# Patient Record
Sex: Female | Born: 1965 | Race: White | Marital: Married | Smoking: Current every day smoker
Health system: Southern US, Community
[De-identification: ages and names within clinical notes are randomized; demographics above are authoritative.]

## PROBLEM LIST (undated history)

## (undated) DIAGNOSIS — A159 Respiratory tuberculosis unspecified: Secondary | ICD-10-CM

## (undated) DIAGNOSIS — K219 Gastro-esophageal reflux disease without esophagitis: Secondary | ICD-10-CM

## (undated) DIAGNOSIS — I10 Essential (primary) hypertension: Secondary | ICD-10-CM

## (undated) DIAGNOSIS — K2270 Barrett's esophagus without dysplasia: Secondary | ICD-10-CM

## (undated) DIAGNOSIS — I499 Cardiac arrhythmia, unspecified: Secondary | ICD-10-CM

## (undated) DIAGNOSIS — T8859XA Other complications of anesthesia, initial encounter: Secondary | ICD-10-CM

## (undated) DIAGNOSIS — E119 Type 2 diabetes mellitus without complications: Secondary | ICD-10-CM

## (undated) HISTORY — PX: ABDOMINOPLASTY: SUR9

## (undated) HISTORY — PX: ANTERIOR CRUCIATE LIGAMENT REPAIR: SHX115

## (undated) HISTORY — PX: CHOLECYSTECTOMY: SHX55

## (undated) HISTORY — PX: TONSILLECTOMY: SUR1361

## (undated) HISTORY — PX: KNEE ARTHROSCOPY: SUR90

---

## 2014-10-03 DIAGNOSIS — M1711 Unilateral primary osteoarthritis, right knee: Secondary | ICD-10-CM | POA: Insufficient documentation

## 2014-11-20 DIAGNOSIS — Z01818 Encounter for other preprocedural examination: Secondary | ICD-10-CM | POA: Insufficient documentation

## 2014-11-20 DIAGNOSIS — S83241A Other tear of medial meniscus, current injury, right knee, initial encounter: Secondary | ICD-10-CM | POA: Insufficient documentation

## 2015-01-01 DIAGNOSIS — M67361 Transient synovitis, right knee: Secondary | ICD-10-CM | POA: Insufficient documentation

## 2015-10-03 DIAGNOSIS — N6019 Diffuse cystic mastopathy of unspecified breast: Secondary | ICD-10-CM | POA: Insufficient documentation

## 2015-10-03 DIAGNOSIS — K760 Fatty (change of) liver, not elsewhere classified: Secondary | ICD-10-CM | POA: Insufficient documentation

## 2015-10-03 DIAGNOSIS — E669 Obesity, unspecified: Secondary | ICD-10-CM | POA: Insufficient documentation

## 2015-10-03 DIAGNOSIS — F17200 Nicotine dependence, unspecified, uncomplicated: Secondary | ICD-10-CM | POA: Insufficient documentation

## 2015-10-03 DIAGNOSIS — K219 Gastro-esophageal reflux disease without esophagitis: Secondary | ICD-10-CM | POA: Insufficient documentation

## 2015-10-03 DIAGNOSIS — N950 Postmenopausal bleeding: Secondary | ICD-10-CM | POA: Insufficient documentation

## 2015-10-03 DIAGNOSIS — F411 Generalized anxiety disorder: Secondary | ICD-10-CM | POA: Insufficient documentation

## 2015-10-03 DIAGNOSIS — G44009 Cluster headache syndrome, unspecified, not intractable: Secondary | ICD-10-CM | POA: Insufficient documentation

## 2015-10-03 DIAGNOSIS — D259 Leiomyoma of uterus, unspecified: Secondary | ICD-10-CM | POA: Insufficient documentation

## 2015-10-03 DIAGNOSIS — M5030 Other cervical disc degeneration, unspecified cervical region: Secondary | ICD-10-CM | POA: Insufficient documentation

## 2015-11-21 DIAGNOSIS — Z4789 Encounter for other orthopedic aftercare: Secondary | ICD-10-CM | POA: Insufficient documentation

## 2015-12-30 DIAGNOSIS — M659 Synovitis and tenosynovitis, unspecified: Secondary | ICD-10-CM | POA: Insufficient documentation

## 2016-03-12 DIAGNOSIS — M7121 Synovial cyst of popliteal space [Baker], right knee: Secondary | ICD-10-CM | POA: Insufficient documentation

## 2016-07-06 DIAGNOSIS — R072 Precordial pain: Secondary | ICD-10-CM | POA: Insufficient documentation

## 2016-07-06 DIAGNOSIS — R918 Other nonspecific abnormal finding of lung field: Secondary | ICD-10-CM | POA: Insufficient documentation

## 2016-07-06 DIAGNOSIS — I10 Essential (primary) hypertension: Secondary | ICD-10-CM | POA: Insufficient documentation

## 2016-10-08 DIAGNOSIS — R7303 Prediabetes: Secondary | ICD-10-CM | POA: Insufficient documentation

## 2017-01-06 DIAGNOSIS — E782 Mixed hyperlipidemia: Secondary | ICD-10-CM | POA: Insufficient documentation

## 2017-01-21 DIAGNOSIS — R591 Generalized enlarged lymph nodes: Secondary | ICD-10-CM | POA: Insufficient documentation

## 2017-04-19 DIAGNOSIS — M25512 Pain in left shoulder: Secondary | ICD-10-CM | POA: Insufficient documentation

## 2017-04-27 DIAGNOSIS — M5412 Radiculopathy, cervical region: Secondary | ICD-10-CM | POA: Insufficient documentation

## 2017-05-03 ENCOUNTER — Other Ambulatory Visit: Payer: Self-pay | Admitting: Sports Medicine

## 2017-05-03 DIAGNOSIS — M5412 Radiculopathy, cervical region: Secondary | ICD-10-CM

## 2017-05-10 ENCOUNTER — Ambulatory Visit
Admission: RE | Admit: 2017-05-10 | Discharge: 2017-05-10 | Disposition: A | Discharge status: Home / Self Care | Payer: BLUE CROSS/BLUE SHIELD | Source: Ambulatory Visit | Attending: Sports Medicine | Admitting: Sports Medicine

## 2017-05-10 DIAGNOSIS — M5412 Radiculopathy, cervical region: Secondary | ICD-10-CM

## 2017-05-10 MED ORDER — IOPAMIDOL (ISOVUE-M 300) INJECTION 61%
1.0000 mL | Freq: Once | INTRAMUSCULAR | Status: AC | PRN
  Administered 2017-05-10: 1 mL via EPIDURAL

## 2017-05-10 MED ORDER — TRIAMCINOLONE ACETONIDE 40 MG/ML IJ SUSP (RADIOLOGY)
60.0000 mg | Freq: Once | INTRAMUSCULAR | Status: AC
  Administered 2017-05-10: 60 mg via EPIDURAL

## 2017-05-10 NOTE — Discharge Instructions (Signed)

## 2017-05-26 ENCOUNTER — Other Ambulatory Visit: Payer: Self-pay | Admitting: Sports Medicine

## 2017-05-26 DIAGNOSIS — M5412 Radiculopathy, cervical region: Secondary | ICD-10-CM

## 2017-05-31 ENCOUNTER — Ambulatory Visit
Admission: RE | Admit: 2017-05-31 | Discharge: 2017-05-31 | Disposition: A | Discharge status: Home / Self Care | Payer: BLUE CROSS/BLUE SHIELD | Source: Ambulatory Visit | Attending: Sports Medicine | Admitting: Sports Medicine

## 2017-05-31 DIAGNOSIS — M5412 Radiculopathy, cervical region: Secondary | ICD-10-CM

## 2017-05-31 MED ORDER — IOPAMIDOL (ISOVUE-M 300) INJECTION 61%
1.0000 mL | Freq: Once | INTRAMUSCULAR | Status: AC | PRN
  Administered 2017-05-31: 1 mL via EPIDURAL

## 2017-05-31 MED ORDER — TRIAMCINOLONE ACETONIDE 40 MG/ML IJ SUSP (RADIOLOGY)
60.0000 mg | Freq: Once | INTRAMUSCULAR | Status: AC
  Administered 2017-05-31: 60 mg via EPIDURAL

## 2017-05-31 NOTE — Discharge Instructions (Signed)

## 2017-06-29 ENCOUNTER — Other Ambulatory Visit: Payer: Self-pay | Admitting: Sports Medicine

## 2017-06-29 DIAGNOSIS — M5412 Radiculopathy, cervical region: Secondary | ICD-10-CM

## 2017-07-14 ENCOUNTER — Ambulatory Visit
Admission: RE | Admit: 2017-07-14 | Discharge: 2017-07-14 | Disposition: A | Discharge status: Home / Self Care | Payer: BLUE CROSS/BLUE SHIELD | Source: Ambulatory Visit | Attending: Sports Medicine | Admitting: Sports Medicine

## 2017-07-14 DIAGNOSIS — M5412 Radiculopathy, cervical region: Secondary | ICD-10-CM

## 2017-07-14 MED ORDER — TRIAMCINOLONE ACETONIDE 40 MG/ML IJ SUSP (RADIOLOGY): 60.0000 mg | Freq: Once | INTRAMUSCULAR | Status: DC

## 2017-07-14 MED ORDER — IOPAMIDOL (ISOVUE-M 300) INJECTION 61%: 1.0000 mL | Freq: Once | INTRAMUSCULAR | Status: DC | PRN

## 2018-01-09 ENCOUNTER — Other Ambulatory Visit: Payer: Self-pay | Admitting: Sports Medicine

## 2018-01-09 DIAGNOSIS — M5412 Radiculopathy, cervical region: Secondary | ICD-10-CM

## 2018-01-20 ENCOUNTER — Ambulatory Visit
Admission: RE | Admit: 2018-01-20 | Discharge: 2018-01-20 | Disposition: A | Discharge status: Home / Self Care | Payer: BLUE CROSS/BLUE SHIELD | Source: Ambulatory Visit | Attending: Sports Medicine | Admitting: Sports Medicine

## 2018-01-20 DIAGNOSIS — M5412 Radiculopathy, cervical region: Secondary | ICD-10-CM

## 2018-01-20 MED ORDER — IOPAMIDOL (ISOVUE-M 300) INJECTION 61%
1.0000 mL | Freq: Once | INTRAMUSCULAR | Status: AC | PRN
  Administered 2018-01-20: 1 mL via EPIDURAL

## 2018-01-20 MED ORDER — TRIAMCINOLONE ACETONIDE 40 MG/ML IJ SUSP (RADIOLOGY)
60.0000 mg | Freq: Once | INTRAMUSCULAR | Status: AC
  Administered 2018-01-20: 60 mg via EPIDURAL

## 2018-01-20 NOTE — Discharge Instructions (Signed)

## 2019-11-30 ENCOUNTER — Other Ambulatory Visit: Payer: Self-pay | Admitting: Sports Medicine

## 2019-11-30 DIAGNOSIS — M5412 Radiculopathy, cervical region: Secondary | ICD-10-CM

## 2019-12-14 ENCOUNTER — Other Ambulatory Visit: Payer: Self-pay

## 2019-12-14 ENCOUNTER — Ambulatory Visit
Admission: RE | Admit: 2019-12-14 | Discharge: 2019-12-14 | Disposition: A | Discharge status: Home / Self Care | Payer: Managed Care, Other (non HMO) | Source: Ambulatory Visit | Attending: Sports Medicine | Admitting: Sports Medicine

## 2019-12-14 DIAGNOSIS — M5412 Radiculopathy, cervical region: Secondary | ICD-10-CM

## 2019-12-14 MED ORDER — TRIAMCINOLONE ACETONIDE 40 MG/ML IJ SUSP (RADIOLOGY)
60.0000 mg | Freq: Once | INTRAMUSCULAR | Status: AC
  Administered 2019-12-14: 60 mg via EPIDURAL

## 2019-12-14 MED ORDER — IOPAMIDOL (ISOVUE-M 300) INJECTION 61%
1.0000 mL | Freq: Once | INTRAMUSCULAR | Status: AC | PRN
  Administered 2019-12-14: 09:00:00 1 mL via EPIDURAL

## 2019-12-14 NOTE — Discharge Instructions (Signed)

## 2021-03-05 ENCOUNTER — Other Ambulatory Visit: Payer: Self-pay | Admitting: Student

## 2021-03-05 DIAGNOSIS — M5416 Radiculopathy, lumbar region: Secondary | ICD-10-CM

## 2021-03-09 ENCOUNTER — Other Ambulatory Visit: Payer: Self-pay | Admitting: Student

## 2021-03-09 DIAGNOSIS — Z9189 Other specified personal risk factors, not elsewhere classified: Secondary | ICD-10-CM

## 2021-03-12 ENCOUNTER — Other Ambulatory Visit: Payer: Managed Care, Other (non HMO)

## 2021-03-13 ENCOUNTER — Other Ambulatory Visit: Payer: Self-pay

## 2021-03-13 ENCOUNTER — Ambulatory Visit
Admission: RE | Admit: 2021-03-13 | Discharge: 2021-03-13 | Disposition: A | Discharge status: Home / Self Care | Payer: Managed Care, Other (non HMO) | Source: Ambulatory Visit | Attending: Student | Admitting: Student

## 2021-03-13 DIAGNOSIS — Z9189 Other specified personal risk factors, not elsewhere classified: Secondary | ICD-10-CM

## 2021-03-14 ENCOUNTER — Other Ambulatory Visit: Payer: Managed Care, Other (non HMO)

## 2021-03-18 ENCOUNTER — Ambulatory Visit
Admission: RE | Admit: 2021-03-18 | Discharge: 2021-03-18 | Disposition: A | Discharge status: Home / Self Care | Payer: Self-pay | Source: Ambulatory Visit | Attending: Student | Admitting: Student

## 2021-03-18 ENCOUNTER — Other Ambulatory Visit: Payer: Self-pay

## 2021-03-18 DIAGNOSIS — M5416 Radiculopathy, lumbar region: Secondary | ICD-10-CM

## 2021-03-20 ENCOUNTER — Other Ambulatory Visit: Payer: Managed Care, Other (non HMO)

## 2021-03-20 ENCOUNTER — Other Ambulatory Visit: Payer: Self-pay | Admitting: Student

## 2021-03-20 DIAGNOSIS — M25552 Pain in left hip: Secondary | ICD-10-CM

## 2021-03-24 ENCOUNTER — Ambulatory Visit
Admission: RE | Admit: 2021-03-24 | Discharge: 2021-03-24 | Disposition: A | Discharge status: Home / Self Care | Payer: Self-pay | Source: Ambulatory Visit | Attending: Student | Admitting: Student

## 2021-03-24 DIAGNOSIS — M25552 Pain in left hip: Secondary | ICD-10-CM

## 2021-04-02 ENCOUNTER — Other Ambulatory Visit: Payer: Self-pay

## 2021-04-02 ENCOUNTER — Other Ambulatory Visit (HOSPITAL_COMMUNITY): Payer: Self-pay | Admitting: Student

## 2021-04-02 ENCOUNTER — Ambulatory Visit (HOSPITAL_COMMUNITY)
Admission: RE | Admit: 2021-04-02 | Discharge: 2021-04-02 | Disposition: A | Discharge status: Home / Self Care | Payer: Managed Care, Other (non HMO) | Source: Ambulatory Visit | Attending: Student | Admitting: Student

## 2021-04-02 DIAGNOSIS — M79605 Pain in left leg: Secondary | ICD-10-CM | POA: Diagnosis present

## 2021-04-02 DIAGNOSIS — M7989 Other specified soft tissue disorders: Secondary | ICD-10-CM

## 2021-10-27 ENCOUNTER — Other Ambulatory Visit: Payer: Self-pay | Admitting: Neurosurgery

## 2021-10-27 DIAGNOSIS — M542 Cervicalgia: Secondary | ICD-10-CM

## 2021-10-28 ENCOUNTER — Ambulatory Visit
Admission: RE | Admit: 2021-10-28 | Discharge: 2021-10-28 | Disposition: A | Discharge status: Home / Self Care | Payer: 59 | Source: Ambulatory Visit | Attending: Neurosurgery | Admitting: Neurosurgery

## 2021-10-28 ENCOUNTER — Other Ambulatory Visit: Payer: Self-pay

## 2021-10-28 DIAGNOSIS — M542 Cervicalgia: Secondary | ICD-10-CM

## 2021-11-04 ENCOUNTER — Other Ambulatory Visit: Payer: Self-pay | Admitting: Neurosurgery

## 2021-11-20 NOTE — Pre-Procedure Instructions (Signed)
Surgical Instructions    Your procedure is scheduled on Wednesday, March 1st.  Report to [facility] Main Entrance "A" at 8:40 A.M., then check in with the Admitting office.  Call this number if you have problems the morning of surgery:  [phone]   If you have any questions prior to your surgery date call [phone]: Open Monday-Friday 8am-4pm    Remember:  Do not eat or drink after midnight the night before your surgery    Take these medicines the morning of surgery with A SIP OF WATER  amLODipine (NORVASC)  labetalol (NORMODYNE) loratadine (CLARITIN) omeprazole (PRILOSEC)  acetaminophen (TYLENOL)-as needed  As of today, STOP taking any Aspirin (unless otherwise instructed by your surgeon) Aleve, Naproxen, Ibuprofen, Motrin, Advil, Goody's, BC's, all herbal medications, fish oil, and all vitamins.                     Do NOT Smoke (Tobacco/Vaping) for 24 hours prior to your procedure.  If you use a CPAP at night, you may bring your mask/headgear for your overnight stay.   Contacts, glasses, piercing's, hearing aid's, dentures or partials may not be worn into surgery, please bring cases for these belongings.    For patients admitted to the hospital, discharge time will be determined by your treatment team.   Patients discharged the day of surgery will not be allowed to drive home, and someone needs to stay with them for 24 hours.  NO VISITORS WILL BE ALLOWED IN PRE-OP WHERE PATIENTS ARE PREPPED FOR SURGERY.  ONLY 1 SUPPORT PERSON MAY BE PRESENT IN THE WAITING ROOM WHILE YOU ARE IN SURGERY.  IF YOU ARE TO BE ADMITTED, ONCE YOU ARE IN YOUR ROOM YOU WILL BE ALLOWED TWO (2) VISITORS. (1) VISITOR MAY STAY OVERNIGHT BUT MUST ARRIVE TO THE ROOM BY 8pm.  Minor children may have two parents present. Special consideration for safety and communication needs will be reviewed on a case by case basis.   Special instructions:   [facility]- Preparing For Surgery  Before surgery, you can  play an important role. Because skin is not sterile, your skin needs to be as free of germs as possible. You can reduce the number of germs on your skin by washing with CHG (chlorahexidine gluconate) Soap before surgery.  CHG is an antiseptic cleaner which kills germs and bonds with the skin to continue killing germs even after washing.    Oral Hygiene is also important to reduce your risk of infection.  Remember - BRUSH YOUR TEETH THE MORNING OF SURGERY WITH YOUR REGULAR TOOTHPASTE  Please do not use if you have an allergy to CHG or antibacterial soaps. If your skin becomes reddened/irritated stop using the CHG.  Do not shave (including legs and underarms) for at least 48 hours prior to first CHG shower. It is OK to shave your face.  Please follow these instructions carefully.   Shower the NIGHT BEFORE SURGERY and the MORNING OF SURGERY  If you chose to wash your hair, wash your hair first as usual with your normal shampoo.  After you shampoo, rinse your hair and body thoroughly to remove the shampoo.  Use CHG Soap as you would any other liquid soap. You can apply CHG directly to the skin and wash gently with a scrungie or a clean washcloth.   Apply the CHG Soap to your body ONLY FROM THE NECK DOWN.  Do not use on open wounds or open sores. Avoid contact with your eyes,  ears, mouth and genitals (private parts). Wash Face and genitals (private parts)  with your normal soap.   Wash thoroughly, paying special attention to the area where your surgery will be performed.  Thoroughly rinse your body with warm water from the neck down.  DO NOT shower/wash with your normal soap after using and rinsing off the CHG Soap.  Pat yourself dry with a CLEAN TOWEL.  Wear CLEAN PAJAMAS to bed the night before surgery  Place CLEAN SHEETS on your bed the night before your surgery  DO NOT SLEEP WITH PETS.   Day of Surgery: Shower with CHG soap. Do not wear jewelry, make up, nail polish, gel polish,  artificial nails, or any other type of covering on natural nails including finger and toenails. If patients have artificial nails, gel coating, etc. that need to be removed by a nail salon please have this removed prior to surgery. Surgery may need to be canceled/delayed if the surgeon/anesthesiologist feels like the patient is unable to be adequately monitored. Do not wear lotions, powders, perfumes, or deodorant. Do not shave 48 hours prior to surgery.   Do not bring valuables to the hospital. [facility] is not responsible for any belongings or valuables. Wear Clean/Comfortable clothing the morning of surgery Remember to brush your teeth WITH YOUR REGULAR TOOTHPASTE.   Please read over the following fact sheets that you were given.   3 days prior to your procedure or After your COVID test   You are not required to quarantine however you are required to wear a well-fitting mask when you are out and around people not in your household. If your mask becomes wet or soiled, replace with a new one.   Wash your hands often with soap and water for 20 seconds or clean your hands with an alcohol-based hand sanitizer that contains at least 60% alcohol.   Do not share personal items.   Notify your provider:  o if you are in close contact with someone who has COVID  o or if you develop a fever of 100.4 or greater, sneezing, cough, sore throat, shortness of breath or body aches.

## 2021-11-23 ENCOUNTER — Other Ambulatory Visit: Payer: Self-pay

## 2021-11-23 ENCOUNTER — Encounter (HOSPITAL_COMMUNITY)
Admission: RE | Admit: 2021-11-23 | Discharge: 2021-11-23 | Disposition: A | Discharge status: Home / Self Care | Payer: 59 | Source: Ambulatory Visit | Attending: Neurosurgery | Admitting: Neurosurgery

## 2021-11-23 ENCOUNTER — Encounter (HOSPITAL_COMMUNITY): Payer: Self-pay

## 2021-11-23 VITALS — BP 166/93 | HR 88 | Temp 98.0°F | Resp 18 | Ht 61.0 in | Wt 170.6 lb

## 2021-11-23 DIAGNOSIS — E118 Type 2 diabetes mellitus with unspecified complications: Secondary | ICD-10-CM | POA: Insufficient documentation

## 2021-11-23 DIAGNOSIS — Z01812 Encounter for preprocedural laboratory examination: Secondary | ICD-10-CM | POA: Insufficient documentation

## 2021-11-23 DIAGNOSIS — I2510 Atherosclerotic heart disease of native coronary artery without angina pectoris: Secondary | ICD-10-CM | POA: Insufficient documentation

## 2021-11-23 DIAGNOSIS — Z20822 Contact with and (suspected) exposure to covid-19: Secondary | ICD-10-CM | POA: Insufficient documentation

## 2021-11-23 DIAGNOSIS — K760 Fatty (change of) liver, not elsewhere classified: Secondary | ICD-10-CM | POA: Insufficient documentation

## 2021-11-23 DIAGNOSIS — Z01818 Encounter for other preprocedural examination: Secondary | ICD-10-CM

## 2021-11-23 HISTORY — DX: Gastro-esophageal reflux disease without esophagitis: K21.9

## 2021-11-23 HISTORY — DX: Cardiac arrhythmia, unspecified: I49.9

## 2021-11-23 HISTORY — DX: Essential (primary) hypertension: I10

## 2021-11-23 HISTORY — DX: Respiratory tuberculosis unspecified: A15.9

## 2021-11-23 HISTORY — DX: Other complications of anesthesia, initial encounter: T88.59XA

## 2021-11-23 HISTORY — DX: Barrett's esophagus without dysplasia: K22.70

## 2021-11-23 HISTORY — DX: Type 2 diabetes mellitus without complications: E11.9

## 2021-11-23 LAB — HEPATIC FUNCTION PANEL
ALT: 23 U/L (ref 0–44)
AST: 20 U/L (ref 15–41)
Albumin: 4.1 g/dL (ref 3.5–5.0)
Alkaline Phosphatase: 78 U/L (ref 38–126)
Bilirubin, Direct: <0.1 mg/dL (ref 0.0–0.2)
Total Bilirubin: 0.2 mg/dL — ABNORMAL LOW (ref 0.3–1.2)
Total Protein: 6.9 g/dL (ref 6.5–8.1)

## 2021-11-23 LAB — BASIC METABOLIC PANEL WITH GFR
Anion gap: 12 (ref 5–15)
BUN: 11 mg/dL (ref 6–20)
CO2: 23 mmol/L (ref 22–32)
Calcium: 9.6 mg/dL (ref 8.9–10.3)
Chloride: 103 mmol/L (ref 98–111)
Creatinine, Ser: 0.87 mg/dL (ref 0.44–1.00)
GFR, Estimated: >60 mL/min
Glucose, Bld: 230 mg/dL — ABNORMAL HIGH (ref 70–99)
Potassium: 4.4 mmol/L (ref 3.5–5.1)
Sodium: 138 mmol/L (ref 135–145)

## 2021-11-23 LAB — SARS CORONAVIRUS 2 BY RT PCR (HOSPITAL ORDER, PERFORMED IN ~~LOC~~ HOSPITAL LAB): SARS Coronavirus 2: NEGATIVE

## 2021-11-23 LAB — CBC
HCT: 42.6 % (ref 36.0–46.0)
Hemoglobin: 14.5 g/dL (ref 12.0–15.0)
MCH: 32.7 pg (ref 26.0–34.0)
MCHC: 34 g/dL (ref 30.0–36.0)
MCV: 96.2 fL (ref 80.0–100.0)
Platelets: 358 K/uL (ref 150–400)
RBC: 4.43 MIL/uL (ref 3.87–5.11)
RDW: 13.2 % (ref 11.5–15.5)
WBC: 13.5 K/uL — ABNORMAL HIGH (ref 4.0–10.5)
nRBC: 0 % (ref 0.0–0.2)

## 2021-11-23 LAB — TYPE AND SCREEN
ABO/RH(D): O POS
Antibody Screen: NEGATIVE

## 2021-11-23 LAB — HEMOGLOBIN A1C
Hgb A1c MFr Bld: 7.1 % — ABNORMAL HIGH (ref 4.8–5.6)
Mean Plasma Glucose: 157.07 mg/dL

## 2021-11-23 LAB — SURGICAL PCR SCREEN
MRSA, PCR: NEGATIVE
Staphylococcus aureus: NEGATIVE

## 2021-11-23 NOTE — Progress Notes (Signed)
PCP - Sofie Rower, FNP Cardiologist - Dr. Loni Beckwith  PPM/ICD - n/a  Chest x-ray - n/a EKG - 05/27/21-CE. Requested tracing.  Stress Test - 06/05/21-CE ECHO - 04/20/21-CE Cardiac Cath - denies  Sleep Study - denies CPAP - denies  Blood Thinner Instructions: n/a Aspirin Instructions: n/a  NPO at MD  COVID TEST- 11/23/21, done in PAT   Anesthesia review: Yes, cardiac history with arrhythmias and cardiac ablation. EKG requested from ED visit on 05/27/21.  Patient denies shortness of breath, fever, cough and chest pain at PAT appointment   All instructions explained to the patient, with a verbal understanding of the material. Patient agrees to go over the instructions while at home for a better understanding. Patient also instructed to self quarantine after being tested for COVID-19. The opportunity to ask questions was provided.

## 2021-11-24 ENCOUNTER — Encounter (HOSPITAL_COMMUNITY): Payer: Self-pay

## 2021-11-24 NOTE — Progress Notes (Addendum)
Anesthesia Chart Review:  Patient follows with cardiology at [facility] for history of PSVT s/p ablation ~10 years ago.  She went a number of years without recurrence, however now she is having 1-2 episodes a month.  These have recently been reevaluated with event monitor.  She was unable to tolerate Toprol but continues on labetalol.  She prefers to manage these conservatively at this time.  She also recently had nuclear stress test 06/05/2021 that was low risk, nonischemic.  Echo 04/20/2021 showed LVEF 65 to 70%, normal wall motion, normal valves.  Last seen by Dr. Elonda Husky 05/29/2021 at that time advised to continue current management follow-up in 6 months.  Patient reports history of postoperative shivering that has happened on 3 occasions after EGD, knee arthroscopy, and gallbladder surgery.  She says this has been successfully managed with meperidine in the past.  She says she has also been previously given a bair hugger postoperatively.  She also has a history of coughing postoperatively she relates this to her history of Barrett's esophagus.  History of DM2, diet controlled.  A1c 7.1 on preop labs.  Stable compared with prior A1c 6.9 on 02/05/2021.  Preop labs reviewed, WBC mildly elevated at 13.5 (currently on prednisone), otherwise unremarkable.  EKG 05/27/2021 (copy on chart): NSR.  Rate 81.  Nuclear stress 06/05/2021 (Care Everywhere): IMPRESSION: Essentially normal cardiac perfusion exam. Prognostically this is a low risk scan   TECHNIQUE: After the perfusion exam, gated images the left ventricle were analyzed by computer. Left ventricular ejection fraction was measured.   FINDINGS: Left ventricular ejection fraction is calculated to be 71 %.   IMPRESSION: Left ventricular ejection fraction of 71%.   TECHNIQUE: Gated imaging of the left ventricle was performed after the perfusion exam.   FINDINGS: Dynamic imaging of the left ventricle demonstrates no wall motion abnormalities.   IMPRESSION:  Normal left ventricular wall motion and systolic function.   Event monitor 05/26/2021 (Care Everywhere): 1.  Normal sinus rhythm predominates recording  2.  Sinus tachycardia is noted during daytime activity, sinus bradycardia is noted during evening hours  3.  Some episodes of sinus tachycardia at a rate of 155 bpm do not have a clearly identified P waves and this could represent SVT  4.  Brief episodes of wide-complex tachycardia consistent with ventricular tachycardia are noted, no sustained arrhythmias are noted  TTE 04/20/2021 (Care Everywhere): Left Ventricle: Systolic function is normal. EF: 65-70%.    Left Ventricle: Wall thickness is normal.    Left Ventricle: Left ventricle size is normal.    Left Ventricle: No regional wall motion abnormalities noted.    Left Ventricle: Doppler parameters indicate normal diastolic function.    Left Atrium: Left atrium is normal in size. Left atrium volume index is  normal (16-34 mL/m2).    Aortic Valve: The aortic valve is tricuspid. The leaflets are not  thickened and exhibit normal excursion.    Aortic Valve: There is no regurgitation or stenosis.    Mitral Valve: Mitral valve structure is normal.    Mitral Valve: There is no prolapse.    Mitral Valve: There is trace regurgitation.    Wynonia Musty [facility]/Anesthesiology Phone [phone] 11/24/2021 10:46 AM

## 2021-11-24 NOTE — Anesthesia Preprocedure Evaluation (Addendum)
Anesthesia Evaluation  ?Patient identified by MRN, date of birth, ID band ?Patient awake ? ? ? ?Reviewed: ?Allergy & Precautions, NPO status , Patient's Chart, lab work & pertinent test results ? ?History of Anesthesia Complications ?(+) PONV ? ?Airway ?Mallampati: I ? ?TM Distance: >3 FB ?Neck ROM: Full ? ? ? Dental ? ?(+) Dental Advisory Given ?  ?Pulmonary ?Current Smoker and Patient abstained from smoking.,  ?11/23/2021 ?  ?breath sounds clear to auscultation ? ? ? ? ? ? Cardiovascular ?hypertension, Pt. on medications ?(-) angina+ dysrhythmias (rare, s/p ablation) Supra Ventricular Tachycardia  ?Rhythm:Regular Rate:Normal ? ? ?  ?Neuro/Psych ?Anxiety   ? GI/Hepatic ?Neg liver ROS, GERD  Medicated and Controlled,  ?Endo/Other  ?diabetes (glu 159)obese ? Renal/GU ?negative Renal ROS  ? ?  ?Musculoskeletal ? ?(+) Arthritis ,  ? Abdominal ?(+) + obese,   ?Peds ? Hematology ?negative hematology ROS ?(+)   ?Anesthesia Other Findings ? ? Reproductive/Obstetrics ? ?  ? ? ? ? ? ? ? ? ? ? ? ? ? ?  ?  ? ? ? ? ? ?Anesthesia Physical ?Anesthesia Plan ? ?ASA: 3 ? ?Anesthesia Plan: General  ? ?Post-op Pain Management: Tylenol PO (pre-op)*  ? ?Induction: Intravenous ? ?PONV Risk Score and Plan: 2 and Ondansetron, Dexamethasone and Treatment may vary due to age or medical condition ? ?Airway Management Planned: Oral ETT and Video Laryngoscope Planned ? ?Additional Equipment: None ? ?Intra-op Plan:  ? ?Post-operative Plan: Extubation in OR ? ?Informed Consent: I have reviewed the patients History and Physical, chart, labs and discussed the procedure including the risks, benefits and alternatives for the proposed anesthesia with the patient or authorized representative who has indicated his/her understanding and acceptance.  ? ? ? ?Dental advisory given ? ?Plan Discussed with: CRNA and Surgeon ? ?Anesthesia Plan Comments: (PAT note by Karoline Caldwell, PA-C: ? ?Patient follows with cardiology at [facility]  for history of PSVT s/p ablation ~10 years ago.  She went a number of years without recurrence, however now she is having 1-2 episodes a month.  These have recently been reevaluated with event monitor.  She was unable to tolerate Toprol but continues on labetalol.  She prefers to manage these conservatively at this time.  She also recently had nuclear stress test 06/05/2021 that was low risk, nonischemic.  Echo 04/20/2021 showed LVEF 65 to 70%, normal wall motion, normal valves.  Last seen by Dr. Elonda Husky 05/29/2021 at that time advised to continue current management follow-up in 6 months. ? ?Patient reports history of postoperative shivering that has happened on 3 occasions after EGD, knee arthroscopy, and gallbladder surgery.  She says this has been successfully managed with meperidine in the past.  She says she has also been previously given a bair hugger postoperatively.  She also has a history of coughing postoperatively she relates this to her history of Barrett's esophagus. ? ?History of DM2, diet controlled.  A1c 7.1 on preop labs.  Stable compared with prior A1c 6.9 on 02/05/2021. ? ?Preop labs reviewed, WBC mildly elevated at 13.5 (currently on prednisone), otherwise unremarkable. ? ?EKG 05/27/2021 (copy on chart): NSR.  Rate 81. ? ?Nuclear stress 06/05/2021 (Care Everywhere): ?IMPRESSION: Essentially normal cardiac perfusion exam. Prognostically this is a low risk scan  ? ?TECHNIQUE: After the perfusion exam, gated images the left ventricle were analyzed by computer. Left ventricular ejection fraction was measured.  ? ?FINDINGS:?Left ventricular ejection fraction is calculated to be 71 %.  ? ?IMPRESSION: Left ventricular ejection fraction of  71%.  ? ?TECHNIQUE: Gated imaging of the left ventricle was performed after the perfusion exam.  ? ?FINDINGS: Dynamic imaging of the left ventricle demonstrates no wall motion abnormalities.  ? ?IMPRESSION: Normal left ventricular wall motion and systolic function.  ? ?Event  monitor 05/26/2021 (Care Everywhere): ?1. ?Normal sinus rhythm predominates recording  ?2. ?Sinus tachycardia is noted during daytime activity, sinus bradycardia is noted during evening hours  ?3. ?Some episodes of sinus tachycardia at a rate of 155 bpm do not have a clearly identified P waves and this could represent SVT  ?4. ?Brief episodes of wide-complex tachycardia consistent with ventricular tachycardia are noted, no sustained arrhythmias are noted ? ?TTE 04/20/2021 (Care Everywhere): ?Left?Ventricle: Systolic function is normal. EF: 65-70%.  ?? ?Left?Ventricle: Wall thickness is normal.  ?? ?Left?Ventricle: Left ventricle size is normal.  ?? ?Left?Ventricle: No regional wall motion abnormalities noted.  ?? ?Left?Ventricle: Doppler parameters indicate normal diastolic function.  ?? ?Left?Atrium: Left atrium is normal in size. Left atrium volume index is  ?normal (16-34 mL/m2).  ?? ?Aortic?Valve: The aortic valve is tricuspid. The leaflets are not  ?thickened and exhibit normal excursion.  ????Aortic?Valve: There is no regurgitation or stenosis.  ?? ?Mitral?Valve: Mitral valve structure is normal.  ?? ?Mitral?Valve: There is no prolapse.  ?? ?Mitral?Valve: There is trace regurgitation. ? ?)  ? ? ? ?Anesthesia Quick Evaluation ? ?

## 2021-11-24 NOTE — Progress Notes (Signed)
patient voiced understanding of new arrival time of 0630 tomorrow.

## 2021-11-25 ENCOUNTER — Encounter (HOSPITAL_COMMUNITY)
Admission: RE | Disposition: A | Discharge status: Home / Self Care | Payer: Self-pay | Source: Home / Self Care | Attending: Neurosurgery

## 2021-11-25 ENCOUNTER — Other Ambulatory Visit: Payer: Self-pay

## 2021-11-25 ENCOUNTER — Inpatient Hospital Stay (HOSPITAL_COMMUNITY): Payer: 59

## 2021-11-25 ENCOUNTER — Inpatient Hospital Stay (HOSPITAL_COMMUNITY): Payer: 59 | Admitting: Anesthesiology

## 2021-11-25 ENCOUNTER — Inpatient Hospital Stay (HOSPITAL_COMMUNITY): Payer: 59 | Admitting: Physician Assistant

## 2021-11-25 ENCOUNTER — Inpatient Hospital Stay (HOSPITAL_COMMUNITY)
Admission: RE | Admit: 2021-11-25 | Discharge: 2021-11-26 | DRG: 473 | Disposition: A | Discharge status: Home / Self Care | Payer: 59 | Attending: Neurosurgery | Admitting: Neurosurgery

## 2021-11-25 ENCOUNTER — Encounter (HOSPITAL_COMMUNITY): Payer: Self-pay | Admitting: Neurosurgery

## 2021-11-25 DIAGNOSIS — Z882 Allergy status to sulfonamides status: Secondary | ICD-10-CM | POA: Diagnosis not present

## 2021-11-25 DIAGNOSIS — E119 Type 2 diabetes mellitus without complications: Secondary | ICD-10-CM | POA: Diagnosis present

## 2021-11-25 DIAGNOSIS — M4802 Spinal stenosis, cervical region: Secondary | ICD-10-CM

## 2021-11-25 DIAGNOSIS — I10 Essential (primary) hypertension: Secondary | ICD-10-CM

## 2021-11-25 DIAGNOSIS — Z7952 Long term (current) use of systemic steroids: Secondary | ICD-10-CM

## 2021-11-25 DIAGNOSIS — K219 Gastro-esophageal reflux disease without esophagitis: Secondary | ICD-10-CM | POA: Diagnosis present

## 2021-11-25 DIAGNOSIS — Z888 Allergy status to other drugs, medicaments and biological substances status: Secondary | ICD-10-CM | POA: Diagnosis not present

## 2021-11-25 DIAGNOSIS — M2578 Osteophyte, vertebrae: Secondary | ICD-10-CM | POA: Diagnosis present

## 2021-11-25 DIAGNOSIS — M4722 Other spondylosis with radiculopathy, cervical region: Secondary | ICD-10-CM

## 2021-11-25 DIAGNOSIS — Z881 Allergy status to other antibiotic agents status: Secondary | ICD-10-CM

## 2021-11-25 DIAGNOSIS — Z79899 Other long term (current) drug therapy: Secondary | ICD-10-CM | POA: Diagnosis not present

## 2021-11-25 DIAGNOSIS — Z20822 Contact with and (suspected) exposure to covid-19: Secondary | ICD-10-CM | POA: Diagnosis present

## 2021-11-25 DIAGNOSIS — Z419 Encounter for procedure for purposes other than remedying health state, unspecified: Secondary | ICD-10-CM

## 2021-11-25 DIAGNOSIS — F17210 Nicotine dependence, cigarettes, uncomplicated: Secondary | ICD-10-CM | POA: Diagnosis present

## 2021-11-25 DIAGNOSIS — K2270 Barrett's esophagus without dysplasia: Secondary | ICD-10-CM | POA: Diagnosis present

## 2021-11-25 HISTORY — PX: ANTERIOR CERVICAL DECOMP/DISCECTOMY FUSION: SHX1161

## 2021-11-25 LAB — GLUCOSE, CAPILLARY
Glucose-Capillary: 159 mg/dL — ABNORMAL HIGH (ref 70–99)
Glucose-Capillary: 141 mg/dL — ABNORMAL HIGH (ref 70–99)
Glucose-Capillary: 180 mg/dL — ABNORMAL HIGH (ref 70–99)
Glucose-Capillary: 262 mg/dL — ABNORMAL HIGH (ref 70–99)
Glucose-Capillary: 292 mg/dL — ABNORMAL HIGH (ref 70–99)

## 2021-11-25 LAB — ABO/RH: ABO/RH(D): O POS

## 2021-11-25 SURGERY — ANTERIOR CERVICAL DECOMPRESSION/DISCECTOMY FUSION 3 LEVELS
Anesthesia: General | Site: Neck

## 2021-11-25 MED ORDER — ACETAMINOPHEN 500 MG PO TABS: 1000.0000 mg | ORAL_TABLET | Freq: Four times a day (QID) | ORAL | Status: DC | PRN

## 2021-11-25 MED ORDER — FENTANYL CITRATE (PF) 100 MCG/2ML IJ SOLN
INTRAMUSCULAR | Status: DC | PRN
  Administered 2021-11-25: 50 ug via INTRAVENOUS
  Administered 2021-11-25: 200 ug via INTRAVENOUS

## 2021-11-25 MED ORDER — ACETAMINOPHEN 325 MG PO TABS: 650.0000 mg | ORAL_TABLET | ORAL | Status: DC | PRN

## 2021-11-25 MED ORDER — ROCURONIUM BROMIDE 10 MG/ML (PF) SYRINGE
PREFILLED_SYRINGE | INTRAVENOUS | Status: DC | PRN
  Administered 2021-11-25: 30 mg via INTRAVENOUS
  Administered 2021-11-25: 70 mg via INTRAVENOUS

## 2021-11-25 MED ORDER — 0.9 % SODIUM CHLORIDE (POUR BTL) OPTIME
TOPICAL | Status: DC | PRN
  Administered 2021-11-25: 1000 mL

## 2021-11-25 MED ORDER — CEFAZOLIN SODIUM-DEXTROSE 2-4 GM/100ML-% IV SOLN
2.0000 g | INTRAVENOUS | Status: AC
  Administered 2021-11-25: 2 g via INTRAVENOUS
  Filled 2021-11-25: qty 100

## 2021-11-25 MED ORDER — MENTHOL 3 MG MT LOZG: 1.0000 | LOZENGE | OROMUCOSAL | Status: DC | PRN

## 2021-11-25 MED ORDER — INSULIN ASPART 100 UNIT/ML IJ SOLN: 1.0000 [IU] | INTRAMUSCULAR | Status: DC

## 2021-11-25 MED ORDER — CYCLOBENZAPRINE HCL 10 MG PO TABS
10.0000 mg | ORAL_TABLET | Freq: Three times a day (TID) | ORAL | Status: DC | PRN
  Administered 2021-11-25 – 2021-11-26 (×3): 10 mg via ORAL
  Filled 2021-11-25 (×3): qty 1

## 2021-11-25 MED ORDER — INSULIN ASPART 100 UNIT/ML IJ SOLN
0.0000 [IU] | Freq: Every day | INTRAMUSCULAR | Status: DC
  Administered 2021-11-25: 3 [IU] via SUBCUTANEOUS

## 2021-11-25 MED ORDER — ONDANSETRON HCL 4 MG/2ML IJ SOLN: 4.0000 mg | Freq: Four times a day (QID) | INTRAMUSCULAR | Status: DC | PRN

## 2021-11-25 MED ORDER — LACTATED RINGERS IV SOLN: INTRAVENOUS | Status: DC | PRN

## 2021-11-25 MED ORDER — PROPOFOL 10 MG/ML IV BOLUS
INTRAVENOUS | Status: DC | PRN
  Administered 2021-11-25: 100 mg via INTRAVENOUS
  Administered 2021-11-25: 50 mg via INTRAVENOUS

## 2021-11-25 MED ORDER — FENTANYL CITRATE (PF) 250 MCG/5ML IJ SOLN
INTRAMUSCULAR | Status: AC
  Filled 2021-11-25: qty 5

## 2021-11-25 MED ORDER — ROCURONIUM BROMIDE 10 MG/ML (PF) SYRINGE
PREFILLED_SYRINGE | INTRAVENOUS | Status: AC
  Filled 2021-11-25: qty 10

## 2021-11-25 MED ORDER — INSULIN ASPART 100 UNIT/ML IJ SOLN
2.0000 [IU] | Freq: Once | INTRAMUSCULAR | Status: AC
  Administered 2021-11-25: 2 [IU] via SUBCUTANEOUS

## 2021-11-25 MED ORDER — HYDROCODONE-ACETAMINOPHEN 5-325 MG PO TABS
2.0000 | ORAL_TABLET | ORAL | Status: DC | PRN
  Administered 2021-11-25 – 2021-11-26 (×5): 2 via ORAL
  Filled 2021-11-25 (×5): qty 2

## 2021-11-25 MED ORDER — OXYCODONE HCL 5 MG/5ML PO SOLN: 5.0000 mg | Freq: Once | ORAL | Status: AC | PRN

## 2021-11-25 MED ORDER — PHENYLEPHRINE 40 MCG/ML (10ML) SYRINGE FOR IV PUSH (FOR BLOOD PRESSURE SUPPORT)
PREFILLED_SYRINGE | INTRAVENOUS | Status: AC
  Filled 2021-11-25: qty 10

## 2021-11-25 MED ORDER — ONDANSETRON HCL 4 MG PO TABS: 4.0000 mg | ORAL_TABLET | Freq: Four times a day (QID) | ORAL | Status: DC | PRN

## 2021-11-25 MED ORDER — MEPERIDINE HCL 25 MG/ML IJ SOLN: 6.2500 mg | INTRAMUSCULAR | Status: DC | PRN

## 2021-11-25 MED ORDER — OXYCODONE HCL 5 MG PO TABS
5.0000 mg | ORAL_TABLET | Freq: Once | ORAL | Status: AC | PRN
  Administered 2021-11-25: 5 mg via ORAL

## 2021-11-25 MED ORDER — INSULIN ASPART 100 UNIT/ML IJ SOLN
0.0000 [IU] | Freq: Three times a day (TID) | INTRAMUSCULAR | Status: DC
  Administered 2021-11-25: 5 [IU] via SUBCUTANEOUS
  Administered 2021-11-26: 2 [IU] via SUBCUTANEOUS

## 2021-11-25 MED ORDER — ORAL CARE MOUTH RINSE: 15.0000 mL | Freq: Once | OROMUCOSAL | Status: AC

## 2021-11-25 MED ORDER — LABETALOL HCL 100 MG PO TABS
100.0000 mg | ORAL_TABLET | Freq: Two times a day (BID) | ORAL | Status: DC
  Administered 2021-11-25: 100 mg via ORAL
  Filled 2021-11-25 (×2): qty 1

## 2021-11-25 MED ORDER — SODIUM CHLORIDE 0.9 % IV SOLN: 250.0000 mL | INTRAVENOUS | Status: DC

## 2021-11-25 MED ORDER — ONDANSETRON HCL 4 MG/2ML IJ SOLN
INTRAMUSCULAR | Status: DC | PRN
  Administered 2021-11-25: 4 mg via INTRAVENOUS

## 2021-11-25 MED ORDER — PANTOPRAZOLE SODIUM 40 MG PO TBEC
40.0000 mg | DELAYED_RELEASE_TABLET | Freq: Every day | ORAL | Status: DC
Start: 2021-11-26 — End: 2021-11-26

## 2021-11-25 MED ORDER — ONDANSETRON HCL 4 MG/2ML IJ SOLN
INTRAMUSCULAR | Status: AC
  Filled 2021-11-25: qty 2

## 2021-11-25 MED ORDER — THROMBIN 20000 UNITS EX SOLR
CUTANEOUS | Status: AC
  Filled 2021-11-25: qty 20000

## 2021-11-25 MED ORDER — HYDROCORTISONE SOD SUC (PF) 250 MG IJ SOLR
INTRAMUSCULAR | Status: AC
  Filled 2021-11-25: qty 250

## 2021-11-25 MED ORDER — PHENYLEPHRINE 40 MCG/ML (10ML) SYRINGE FOR IV PUSH (FOR BLOOD PRESSURE SUPPORT)
PREFILLED_SYRINGE | INTRAVENOUS | Status: DC | PRN
  Administered 2021-11-25: 120 ug via INTRAVENOUS

## 2021-11-25 MED ORDER — CHLORHEXIDINE GLUCONATE 0.12 % MT SOLN
15.0000 mL | Freq: Once | OROMUCOSAL | Status: AC
  Administered 2021-11-25: 15 mL via OROMUCOSAL
  Filled 2021-11-25: qty 15

## 2021-11-25 MED ORDER — CHLORHEXIDINE GLUCONATE CLOTH 2 % EX PADS: 6.0000 | MEDICATED_PAD | Freq: Once | CUTANEOUS | Status: DC

## 2021-11-25 MED ORDER — MIDAZOLAM HCL 2 MG/2ML IJ SOLN
INTRAMUSCULAR | Status: AC
  Filled 2021-11-25: qty 2

## 2021-11-25 MED ORDER — OXYCODONE HCL 5 MG PO TABS
ORAL_TABLET | ORAL | Status: AC
  Filled 2021-11-25: qty 1

## 2021-11-25 MED ORDER — SODIUM CHLORIDE 0.9% FLUSH: 3.0000 mL | INTRAVENOUS | Status: DC | PRN

## 2021-11-25 MED ORDER — SUGAMMADEX SODIUM 200 MG/2ML IV SOLN
INTRAVENOUS | Status: DC | PRN
  Administered 2021-11-25: 200 mg via INTRAVENOUS

## 2021-11-25 MED ORDER — PREDNISONE 10 MG PO TABS
10.0000 mg | ORAL_TABLET | Freq: Every day | ORAL | Status: DC
  Filled 2021-11-25 (×2): qty 1

## 2021-11-25 MED ORDER — HYDROMORPHONE HCL 1 MG/ML IJ SOLN
0.2500 mg | INTRAMUSCULAR | Status: DC | PRN
  Administered 2021-11-25 (×2): 0.5 mg via INTRAVENOUS

## 2021-11-25 MED ORDER — LORATADINE 10 MG PO TABS: 10.0000 mg | ORAL_TABLET | Freq: Every day | ORAL | Status: DC | PRN

## 2021-11-25 MED ORDER — HYDROMORPHONE HCL 1 MG/ML IJ SOLN
INTRAMUSCULAR | Status: AC
  Filled 2021-11-25: qty 1

## 2021-11-25 MED ORDER — PROPOFOL 10 MG/ML IV BOLUS
INTRAVENOUS | Status: AC
  Filled 2021-11-25: qty 20

## 2021-11-25 MED ORDER — PHENOL 1.4 % MT LIQD: 1.0000 | OROMUCOSAL | Status: DC | PRN

## 2021-11-25 MED ORDER — LACTATED RINGERS IV SOLN: INTRAVENOUS | Status: DC

## 2021-11-25 MED ORDER — ACETAMINOPHEN 500 MG PO TABS
1000.0000 mg | ORAL_TABLET | Freq: Once | ORAL | Status: DC
  Filled 2021-11-25: qty 2

## 2021-11-25 MED ORDER — AMLODIPINE BESYLATE 5 MG PO TABS
10.0000 mg | ORAL_TABLET | Freq: Every day | ORAL | Status: DC
Start: 2021-11-26 — End: 2021-11-26

## 2021-11-25 MED ORDER — MIDAZOLAM HCL 2 MG/2ML IJ SOLN: 0.5000 mg | Freq: Once | INTRAMUSCULAR | Status: DC | PRN

## 2021-11-25 MED ORDER — ALUM & MAG HYDROXIDE-SIMETH 200-200-20 MG/5ML PO SUSP: 30.0000 mL | Freq: Four times a day (QID) | ORAL | Status: DC | PRN

## 2021-11-25 MED ORDER — THROMBIN 5000 UNITS EX SOLR: OROMUCOSAL | Status: DC | PRN

## 2021-11-25 MED ORDER — LIDOCAINE 2% (20 MG/ML) 5 ML SYRINGE
INTRAMUSCULAR | Status: DC | PRN
Start: 2021-11-25 — End: 2021-11-25
  Administered 2021-11-25: 40 mg via INTRAVENOUS

## 2021-11-25 MED ORDER — DEXAMETHASONE SODIUM PHOSPHATE 10 MG/ML IJ SOLN
INTRAMUSCULAR | Status: AC
  Filled 2021-11-25: qty 1

## 2021-11-25 MED ORDER — CEFAZOLIN SODIUM-DEXTROSE 2-4 GM/100ML-% IV SOLN
2.0000 g | Freq: Three times a day (TID) | INTRAVENOUS | Status: AC
  Administered 2021-11-25 (×2): 2 g via INTRAVENOUS
  Filled 2021-11-25 (×2): qty 100

## 2021-11-25 MED ORDER — SODIUM CHLORIDE 0.9% FLUSH
3.0000 mL | Freq: Two times a day (BID) | INTRAVENOUS | Status: DC
  Administered 2021-11-25: 3 mL via INTRAVENOUS

## 2021-11-25 MED ORDER — THROMBIN 5000 UNITS EX SOLR
CUTANEOUS | Status: AC
  Filled 2021-11-25: qty 5000

## 2021-11-25 MED ORDER — MIDAZOLAM HCL 2 MG/2ML IJ SOLN
INTRAMUSCULAR | Status: DC | PRN
  Administered 2021-11-25: 2 mg via INTRAVENOUS

## 2021-11-25 MED ORDER — PHENYLEPHRINE HCL-NACL 20-0.9 MG/250ML-% IV SOLN
INTRAVENOUS | Status: DC | PRN
  Administered 2021-11-25: 25 ug/min via INTRAVENOUS

## 2021-11-25 MED ORDER — PANTOPRAZOLE SODIUM 40 MG IV SOLR: 40.0000 mg | Freq: Every day | INTRAVENOUS | Status: DC

## 2021-11-25 MED ORDER — DEXAMETHASONE SODIUM PHOSPHATE 10 MG/ML IJ SOLN
INTRAMUSCULAR | Status: DC | PRN
  Administered 2021-11-25: 10 mg via INTRAVENOUS

## 2021-11-25 MED ORDER — GELATIN ABSORBABLE 100 EX MISC
CUTANEOUS | Status: DC | PRN
Start: 2021-11-25 — End: 2021-11-25

## 2021-11-25 MED ORDER — HYDROMORPHONE HCL 1 MG/ML IJ SOLN
0.5000 mg | INTRAMUSCULAR | Status: DC | PRN
  Administered 2021-11-25 – 2021-11-26 (×3): 0.5 mg via INTRAVENOUS
  Filled 2021-11-25 (×3): qty 0.5

## 2021-11-25 MED ORDER — INSULIN ASPART 100 UNIT/ML IJ SOLN
0.0000 [IU] | INTRAMUSCULAR | Status: DC | PRN
  Administered 2021-11-25: 2 [IU] via SUBCUTANEOUS
  Filled 2021-11-25: qty 1

## 2021-11-25 MED ORDER — HYDROCORTISONE SOD SUC (PF) 100 MG IJ SOLR
INTRAMUSCULAR | Status: DC | PRN
Start: 2021-11-25 — End: 2021-11-25
  Administered 2021-11-25: 100 mg via INTRAVENOUS

## 2021-11-25 MED ORDER — ACETAMINOPHEN 650 MG RE SUPP: 650.0000 mg | RECTAL | Status: DC | PRN

## 2021-11-25 SURGICAL SUPPLY — 66 items
BAG COUNTER SPONGE SURGICOUNT (BAG) ×2 IMPLANT
BAND RUBBER #18 3X1/16 STRL (MISCELLANEOUS) ×4 IMPLANT
BASKET BONE COLLECTION (BASKET) ×2 IMPLANT
BENZOIN TINCTURE PRP APPL 2/3 (GAUZE/BANDAGES/DRESSINGS) ×2 IMPLANT
BIT DRILL NEURO 2X3.1 SFT TUCH (MISCELLANEOUS) ×1 IMPLANT
BONE VIVIGEN FORMABLE 1.3CC (Bone Implant) ×4 IMPLANT
BUR MATCHSTICK NEURO 3.0 LAGG (BURR) ×1 IMPLANT
CANISTER SUCT 3000ML PPV (MISCELLANEOUS) ×2 IMPLANT
CARTRIDGE OIL MAESTRO DRILL (MISCELLANEOUS) ×1 IMPLANT
DECANTER SPIKE VIAL GLASS SM (MISCELLANEOUS) ×1 IMPLANT
DERMABOND ADVANCED (GAUZE/BANDAGES/DRESSINGS) ×1
DERMABOND ADVANCED .7 DNX12 (GAUZE/BANDAGES/DRESSINGS) ×1 IMPLANT
DIFFUSER DRILL AIR PNEUMATIC (MISCELLANEOUS) ×2 IMPLANT
DRAIN JACKSON PRT FLT 7MM (DRAIN) ×1 IMPLANT
DRAPE C-ARM 42X72 X-RAY (DRAPES) ×4 IMPLANT
DRAPE LAPAROTOMY 100X72 PEDS (DRAPES) ×2 IMPLANT
DRAPE MICROSCOPE LEICA (MISCELLANEOUS) ×2 IMPLANT
DRILL NEURO 2X3.1 SOFT TOUCH (MISCELLANEOUS) ×2
DRSG OPSITE POSTOP 4X6 (GAUZE/BANDAGES/DRESSINGS) ×1 IMPLANT
DURAPREP 6ML APPLICATOR 50/CS (WOUND CARE) ×2 IMPLANT
ELECT COATED BLADE 2.86 ST (ELECTRODE) ×2 IMPLANT
ELECT REM PT RETURN 9FT ADLT (ELECTROSURGICAL) ×2
ELECTRODE REM PT RTRN 9FT ADLT (ELECTROSURGICAL) ×1 IMPLANT
EVACUATOR SILICONE 100CC (DRAIN) ×1 IMPLANT
GAUZE 4X4 16PLY ~~LOC~~+RFID DBL (SPONGE) ×1 IMPLANT
GAUZE SPONGE 4X4 12PLY STRL (GAUZE/BANDAGES/DRESSINGS) ×1 IMPLANT
GLOVE EXAM NITRILE XL STR (GLOVE) IMPLANT
GLOVE SURG ENC MOIS LTX SZ7 (GLOVE) ×1 IMPLANT
GLOVE SURG ENC MOIS LTX SZ8 (GLOVE) ×2 IMPLANT
GLOVE SURG UNDER POLY LF SZ7 (GLOVE) ×1 IMPLANT
GLOVE SURG UNDER POLY LF SZ8.5 (GLOVE) ×2 IMPLANT
GOWN STRL REUS W/ TWL LRG LVL3 (GOWN DISPOSABLE) IMPLANT
GOWN STRL REUS W/ TWL XL LVL3 (GOWN DISPOSABLE) ×1 IMPLANT
GOWN STRL REUS W/TWL 2XL LVL3 (GOWN DISPOSABLE) IMPLANT
GOWN STRL REUS W/TWL LRG LVL3 (GOWN DISPOSABLE) ×2
GOWN STRL REUS W/TWL XL LVL3 (GOWN DISPOSABLE) ×1
GRAFT BNE MATRIX VG FRMBL SM 1 (Bone Implant) IMPLANT
HALTER HD/CHIN CERV TRACTION D (MISCELLANEOUS) ×2 IMPLANT
HEMOSTAT POWDER KIT SURGIFOAM (HEMOSTASIS) ×2 IMPLANT
KIT BASIN OR (CUSTOM PROCEDURE TRAY) ×2 IMPLANT
KIT TURNOVER KIT B (KITS) ×2 IMPLANT
NDL SPNL 20GX3.5 QUINCKE YW (NEEDLE) ×1 IMPLANT
NEEDLE SPNL 20GX3.5 QUINCKE YW (NEEDLE) ×2 IMPLANT
NS IRRIG 1000ML POUR BTL (IV SOLUTION) ×2 IMPLANT
OIL CARTRIDGE MAESTRO DRILL (MISCELLANEOUS) ×2
PACK LAMINECTOMY NEURO (CUSTOM PROCEDURE TRAY) ×2 IMPLANT
PIN DISTRACTION 14MM (PIN) ×2 IMPLANT
PLATE 3 55XLCK NS SPNE CVD (Plate) IMPLANT
PLATE 3 ATLANTIS TRANS (Plate) ×1 IMPLANT
SCREW SD 15MM FA (Screw) ×1 IMPLANT
SCREW ST 15X4XST FXANG NS (Screw) IMPLANT
SCREW ST 15X4XST VA NS SPNE (Screw) IMPLANT
SCREW ST FIX 4 ATL (Screw) ×5 IMPLANT
SCREW ST VAR 4 ATL (Screw) ×2 IMPLANT
SPACER HEDRON 14X16X7 0D (Spacer) ×1 IMPLANT
SPACER HEDRON C 12X14X6 0D (Spacer) ×1 IMPLANT
SPACER HEDRON C 12X14X7 0D (Spacer) ×1 IMPLANT
SPONGE INTESTINAL PEANUT (DISPOSABLE) ×2 IMPLANT
SPONGE SURGIFOAM ABS GEL 100 (HEMOSTASIS) ×2 IMPLANT
STRIP CLOSURE SKIN 1/2X4 (GAUZE/BANDAGES/DRESSINGS) ×2 IMPLANT
SUT VIC AB 3-0 SH 8-18 (SUTURE) ×2 IMPLANT
SUT VIC AB 4-0 PS2 27 (SUTURE) ×2 IMPLANT
TAPE CLOTH 4X10 WHT NS (GAUZE/BANDAGES/DRESSINGS) IMPLANT
TOWEL GREEN STERILE (TOWEL DISPOSABLE) ×2 IMPLANT
TOWEL GREEN STERILE FF (TOWEL DISPOSABLE) ×2 IMPLANT
WATER STERILE IRR 1000ML POUR (IV SOLUTION) ×2 IMPLANT

## 2021-11-25 NOTE — Op Note (Signed)
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 ?Postoperative diagnosis: Same ? ?Procedure: Anterior cervical discectomies and fusion at C4-5, C5-6, C6-7 utilizing the globus titanium cages packed with locally harvested autograft mixed with vivigen and anterior cervical plating utilizing the Medtronic Atlantis plating system at from C4-C7 ? ?Surgeon: Dominica Severin Caral Whan ? ?Assistant: Nash Shearer ? ?Anesthesia: General ? ?EBL: Minimal ? ?HPI: 56 year old female with longstanding neck pain bilateral shoulder and arm pain work-up revealed severe cervical spondylosis foraminally and centrally with cord compression at C4-5 C5-6 and C6-7.  I extensively went over the risks and benefits of this operation with her after she failed all forms conservative treatment as well as perioperative course expectations of outcome and alternatives to surgery and she understood and agreed to proceed forward. ? ?Operative procedure: Patient was brought into the OR was [facility] general anesthesia positioned supine the neck in slight extension 5 pounds halter traction the right side of her neck was prepped and draped in routine sterile fashion.  Preoperative x-ray localized the appropriate level.  So a curvilinear incision was made just off the midline to the anterior border of the sternocleidomastoid and the superficial of platysma was dissected out divided longitudinally.  The avascular plane between the sternocleidomastoid and strap muscles was developed down to the prevertebral fascia and prevertebral fascia was dissected away with Kitners.  Intraoperative x-ray confirmed defecation appropriate level.  So annulotomy was made with a 15 blade scalpel to mark all 3 disc bases anterior osteophytes were bitten off with a Leksell rongeur and a 3 mm Kerrison punch.  All 3 disc bases were then drilled down capturing the bone shavings and mucus trap.  Under  microscopic scopic illumination first working at C6-7, this disc base was further drilled down until identified the posterior annulus and osteophytic complex these were under Bitton with a 1 mm Kerrison punch.  The posterior large ligament was identified and removed in piecemeal fashion.  Then aggressive under biting of both endplates marching laterally both C7 pedicles were identified and both C7 nerve roots were decompressed and skeletonized flush with the pedicle.  At the end decompression was no further stenosis either centrally or foraminally this was packed with Gelfoam and tension taken at C4-5.  At C4-5 the pathology was primarily soft disc but also some posterior spurring centrally but this was all aggressively under Bitton both C5 pedicles were identified and both C5 nerve roots were decompressed and skeletonized flush with the pedicle.  Then tension taken at C 5 6 which again was further drilled down aggressive under biting both endplates removed and extensive mount of osteophyte uncinate was markedly hypertrophied as well this was all removed skeletonizing both C6 nerve roots flush with the pedicle.  At the end decompression was no further stenosis either centrally or foraminally meticulous hemostasis was maintained I sized up to 3 cages packed with locally harvested autograft mixed with Vivigen then inserted all 3 cages confirmed position with fluoroscopy sized up with 55 mm lance translational plate and all screws had excellent purchase and locking mechanisms were engaged.  Packed some additional bone graft laterally to the cages prior to plate placement.  Then placed a JP drain and closed the wound in layers with opted Vicryl in the platysma and a running 4 subcuticular Dermabond benzoin Steri-Strips and a sterile dressing was applied patient recovery in stable condition.  At the end the case all needle counts and sponge counts were correct. ?

## 2021-11-25 NOTE — H&P (Signed)
Danielle Cameron is an 56 y.o. female.   ?Chief Complaint: Neck pain right shoulder and arm pain greater than left ?HPI: 56 year old female with longstanding neck pain right shoulder and arm pain greater than left but bilateral symptoms.  Work-up has revealed severe cervical spondylosis with severe foraminal stenosis and cord compression at C4-5 C5-6 and C6-7.  Due to patient's progression of clinical syndrome imaging findings and failed conservative treatment I recommended anterior cervical discectomies and fusion at those 3 levels.  I extensively reviewed the risks and benefits of the operation with her as well as perioperative course expectations of outcome and alternatives of surgery and she understood and agreed to proceed forward. ? ?Past Medical History:  ?Diagnosis Date  ? Barretts esophagus   ? Complication of anesthesia   ? coughing spells from Barrett's Esophgus and uncontrollably shaking.  ? DM (diabetes mellitus), type 2 ([place])   ? Dysrhythmia   ? SVT's-hx of ablation  ? GERD (gastroesophageal reflux disease)   ? Hypertension   ? Tuberculosis   ? 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 CXR q2years.  ? ? ?Past Surgical History:  ?Procedure Laterality Date  ? ABDOMINOPLASTY    ? ANTERIOR CRUCIATE LIGAMENT REPAIR Left   ? CHOLECYSTECTOMY    ? KNEE ARTHROSCOPY Right   ? TONSILLECTOMY    ? ? ?History reviewed. No pertinent family history. ?Social History:  reports that she has been smoking cigarettes. She has a 7.50 pack-year smoking history. She has never used smokeless tobacco. She reports that she does not currently use alcohol. She reports that she does not use drugs. ? ?Allergies:  ?Allergies  ?Allergen Reactions  ? Sulfa Antibiotics Nausea And Vomiting  ? Benzonatate Rash  ? Macrodantin [Nitrofurantoin] Nausea And Vomiting  ? Sulfamethoxazole Nausea And Vomiting  ? ? ?Medications Prior to Admission  ?Medication Sig Dispense Refill  ? acetaminophen (TYLENOL) 500 MG tablet Take 1,000 mg by  mouth every 6 (six) hours as needed for moderate pain.    ? amLODipine (NORVASC) 10 MG tablet Take 10 mg by mouth daily.    ? Evolocumab (REPATHA) 140 MG/ML SOSY Inject 140 mg into the skin every 14 (fourteen) days.    ? labetalol (NORMODYNE) 100 MG tablet Take 100 mg by mouth 2 (two) times daily.    ? loratadine (CLARITIN) 10 MG tablet Take 10 mg by mouth daily.    ? omeprazole (PRILOSEC) 20 MG capsule Take 20 mg by mouth daily.    ? predniSONE (DELTASONE) 10 MG tablet Take 10 mg by mouth daily.    ? meloxicam (MOBIC) 15 MG tablet Take 15 mg by mouth daily. (Patient not taking: Reported on 11/17/2021)    ? ? ?Results for orders placed or performed during the hospital encounter of 11/25/21 (from the past 48 hour(s))  ?Glucose, capillary     Status: Abnormal  ? Collection Time: 11/25/21  6:56 AM  ?Result Value Ref Range  ? Glucose-Capillary 159 (H) 70 - 99 mg/dL  ?  Comment: Glucose reference range applies only to samples taken after fasting for at least 8 hours.  ? ?No results found. ? ?Review of Systems  ?Musculoskeletal:  Positive for neck pain.  ?Neurological:  Positive for numbness.  ? ?Blood pressure 139/82, pulse 77, temperature 98.1 ?F (36.7 ?C), temperature source Oral, resp. rate 17, height 5\' 1"  (1.549 m), weight 79.4 kg, SpO2 99 %. ?Physical Exam ?HENT:  ?   Head: Normocephalic.  ?   Right Ear:  Tympanic membrane normal.  ?   Nose: Nose normal.  ?   Mouth/Throat:  ?   Mouth: Mucous membranes are moist.  ?Eyes:  ?   Pupils: Pupils are equal, round, and reactive to light.  ?Cardiovascular:  ?   Rate and Rhythm: Normal rate.  ?Pulmonary:  ?   Effort: Pulmonary effort is normal.  ?Abdominal:  ?   General: Abdomen is flat.  ?Skin: ?   General: Skin is warm.  ?Neurological:  ?   Mental Status: She is alert.  ?   Comments: Patient is awake and alert strength is 5 and 5 deltoid, bicep, tricep, wrist flexion, wrist extension, hand intrinsics.  ?  ? ?Assessment/Plan ?56 year old female presents for ACDF C4-5 C5-6  C6-7. ? ?Elaina Hoops, MD ?11/25/2021, 7:58 AM ? ? ? ?

## 2021-11-25 NOTE — Transfer of Care (Signed)
Immediate Anesthesia Transfer of Care Note ? ?Patient: Danielle Cameron ? ?Procedure(s) Performed: Anterior Cervical Decompression Discectomy Fusion Cervical four-five, Cervical five-six, Cervical six-seven (Neck) ? ?Patient Location: PACU ? ?Anesthesia Type:General ? ?Level of Consciousness: awake and oriented ? ?Airway & Oxygen Therapy: Patient Spontanous Breathing ? ?Post-op Assessment: Report given to RN ? ?Post vital signs: Reviewed and stable ? ?Last Vitals:  ?Vitals Value Taken Time  ?BP 120/95 11/25/21 1128  ?Temp    ?Pulse 87 11/25/21 1129  ?Resp 17 11/25/21 1129  ?SpO2 93 % 11/25/21 1129  ?Vitals shown include unvalidated device data. ? ?Last Pain:  ?Vitals:  ? 11/25/21 0636  ?TempSrc: Oral  ?   ? ?  ? ?Complications: No notable events documented. ?

## 2021-11-25 NOTE — Anesthesia Postprocedure Evaluation (Signed)
Anesthesia Post Note ? ?Patient: Danielle Cameron ? ?Procedure(s) Performed: Anterior Cervical Decompression Discectomy Fusion Cervical four-five, Cervical five-six, Cervical six-seven (Neck) ? ?  ? ?Patient location during evaluation: PACU ?Anesthesia Type: General ?Level of consciousness: awake and alert, patient cooperative and oriented ?Pain management: pain level controlled ?Vital Signs Assessment: post-procedure vital signs reviewed and stable ?Respiratory status: spontaneous breathing, nonlabored ventilation and respiratory function stable ?Cardiovascular status: blood pressure returned to baseline and stable ?Postop Assessment: no apparent nausea or vomiting ?Anesthetic complications: no ? ? ?No notable events documented. ? ?Last Vitals:  ?Vitals:  ? 11/25/21 1259 11/25/21 1323  ?BP: (!) 161/78 (!) 148/71  ?Pulse: 77 81  ?Resp: 16 18  ?Temp: 37.1 ?C   ?SpO2: 97% 100%  ?  ?Last Pain:  ?Vitals:  ? 11/25/21 1200  ?TempSrc:   ?PainSc: 8   ? ? ?  ?  ?  ?  ?  ?  ? ?Danielle Cameron,E. Charma Mocarski ? ? ? ? ?

## 2021-11-25 NOTE — Anesthesia Procedure Notes (Signed)
Procedure Name: Intubation ?Date/Time: 11/25/2021 8:38 AM ?Performed by: Barrington Ellison, CRNA ?Pre-anesthesia Checklist: Patient identified, Emergency Drugs available, Suction available and Patient being monitored ?Patient Re-evaluated:Patient Re-evaluated prior to induction ?Oxygen Delivery Method: Circle System Utilized ?Preoxygenation: Pre-oxygenation with 100% oxygen ?Induction Type: IV induction ?Ventilation: Mask ventilation without difficulty ?Laryngoscope Size: Glidescope and 3 ?Grade View: Grade I ?Tube type: Oral ?Tube size: 7.0 mm ?Number of attempts: 1 ?Airway Equipment and Method: Stylet and Oral airway ?Placement Confirmation: ETT inserted through vocal cords under direct vision, positive ETCO2 and breath sounds checked- equal and bilateral ?Secured at: 20 cm ?Tube secured with: Tape ?Dental Injury: Teeth and Oropharynx as per pre-operative assessment  ? ? ? ? ?

## 2021-11-25 NOTE — Progress Notes (Signed)
Orthopedic Tech Progress Note ?Patient Details:  ?Danielle Cameron ?[birth year] ?[id number] ? ?Patient ID: [name], female   DOB: 03/08/1966, 56 y.o.   MRN: [id number] ?Spoke with desk staff and the patient already has a soft collar. ? ?[place] ?11/25/2021, 1:38 PM ? ?

## 2021-11-26 ENCOUNTER — Encounter (HOSPITAL_COMMUNITY): Payer: Self-pay | Admitting: Neurosurgery

## 2021-11-26 LAB — GLUCOSE, CAPILLARY: Glucose-Capillary: 160 mg/dL — ABNORMAL HIGH (ref 70–99)

## 2021-11-26 MED ORDER — CYCLOBENZAPRINE HCL 10 MG PO TABS: 10.0000 mg | ORAL_TABLET | Freq: Three times a day (TID) | ORAL | 0 refills | Status: AC | PRN

## 2021-11-26 MED ORDER — ONDANSETRON HCL 4 MG PO TABS: 4.0000 mg | ORAL_TABLET | Freq: Four times a day (QID) | ORAL | 0 refills | Status: AC | PRN

## 2021-11-26 MED ORDER — HYDROCODONE-ACETAMINOPHEN 5-325 MG PO TABS: 2.0000 | ORAL_TABLET | ORAL | 0 refills | Status: AC | PRN

## 2021-11-26 MED ORDER — ONDANSETRON HCL 4 MG PO TABS: 4.0000 mg | ORAL_TABLET | Freq: Every day | ORAL | 1 refills | Status: AC | PRN

## 2021-11-26 NOTE — Evaluation (Signed)
Physical Therapy Evaluation & Discharge ?Patient Details ?Name: Danielle Cameron ?MRN: [id number] ?DOB: 1966/08/09 ?Today's Date: 11/26/2021 ? ?History of Present Illness ? 56 y/o female admitted on 11/25/21 following ACDF C4-7. PMH: HTN, hx of TB, DM  ?Clinical Impression ? Patient admitted with above procedure. Patient functioning at independent level for mobility with no AD. Educated patient on cervical precautions, brace wear, and progressive walking program, patient verbalized understanding. Patient able to perform ADLs with good ability to maintain precautions. Patient has good understanding of precautions.  No further skilled PT needs identified acutely. No PT follow up recommended at this time.    ?   ? ?Recommendations for follow up therapy are one component of a multi-disciplinary discharge planning process, led by the attending physician.  Recommendations may be updated based on patient status, additional functional criteria and insurance authorization. ? ?Follow Up Recommendations No PT follow up ? ?  ?Assistance Recommended at Discharge PRN  ?Patient can return home with the following ?   ? ?  ?Equipment Recommendations None recommended by PT  ?Recommendations for Other Services ?    ?  ?Functional Status Assessment Patient has had a recent decline in their functional status and demonstrates the ability to make significant improvements in function in a reasonable and predictable amount of time.  ? ?  ?Precautions / Restrictions Precautions ?Precautions: Cervical ?Precaution Booklet Issued: Yes (comment) ?Required Braces or Orthoses: Cervical Brace ?Cervical Brace: Soft collar;At all times ?Restrictions ?Weight Bearing Restrictions: No  ? ?  ? ?Mobility ? Bed Mobility ?  ?  ?  ?  ?  ?  ?  ?General bed mobility comments: Sitting in chair on arrival ?  ? ?Transfers ?Overall transfer level: Independent ?Equipment used: None ?  ?  ?  ?  ?  ?  ?  ?  ?  ? ?Ambulation/Gait ?Ambulation/Gait assistance:  Independent ?Gait Distance (Feet): 400 Feet ?Assistive device: None ?Gait Pattern/deviations: WFL(Within Functional Limits) ?  ?Gait velocity interpretation: >2.62 ft/sec, indicative of community ambulatory ?  ?  ? ?Stairs ?Stairs: Yes ?Stairs assistance: Independent ?Stair Management: No rails, Step to pattern, Forwards ?Number of Stairs: 4 ?  ? ?Wheelchair Mobility ?  ? ?Modified Rankin (Stroke Patients Only) ?  ? ?  ? ?Balance Overall balance assessment: Independent ?  ?  ?  ?  ?  ?  ?  ?  ?  ?  ?  ?  ?  ?  ?  ?  ?  ?  ?   ? ? ? ?Pertinent Vitals/Pain Pain Assessment ?Pain Assessment: Faces ?Faces Pain Scale: Hurts little more ?Pain Location: neck when coughing ?Pain Descriptors / Indicators: Discomfort, Grimacing ?Pain Intervention(s): Monitored during session, Repositioned  ? ? ?Home Living Family/patient expects to be discharged to:: Private residence ?Living Arrangements: Spouse/significant other ?Available Help at Discharge: Family ?Type of Home: House ?Home Access: Stairs to enter ?Entrance Stairs-Rails: None ?Entrance Stairs-Number of Steps: 3 ?Alternate Level Stairs-Number of Steps: has an elevator ?Home Layout: Two level ?Home Equipment: None ?   ?  ?Prior Function Prior Level of Function : Independent/Modified Independent;Working/employed;Driving ?  ?  ?  ?  ?  ?  ?  ?  ?  ? ? ?Hand Dominance  ?   ? ?  ?Extremity/Trunk Assessment  ? Upper Extremity Assessment ?Upper Extremity Assessment: Overall WFL for tasks assessed ?  ? ?Lower Extremity Assessment ?Lower Extremity Assessment: Overall WFL for tasks assessed ?  ? ?Cervical / Trunk Assessment ?Cervical /  Trunk Assessment: Neck Surgery  ?Communication  ? Communication: No difficulties  ?Cognition Arousal/Alertness: Awake/alert ?Behavior During Therapy: [facility] for tasks assessed/performed ?Overall Cognitive Status: Within Functional Limits for tasks assessed ?  ?  ?  ?  ?  ?  ?  ?  ?  ?  ?  ?  ?  ?  ?  ?  ?  ?  ?  ? ?  ?General Comments   ? ?  ?Exercises     ? ?Assessment/Plan  ?  ?PT Assessment Patient does not need any further PT services  ?PT Problem List   ? ?   ?  ?PT Treatment Interventions     ? ?PT Goals (Current goals can be found in the Care Plan section)  ?Acute Rehab PT Goals ?Patient Stated Goal: to go home ?PT Goal Formulation: All assessment and education complete, DC therapy ? ?  ?Frequency   ?  ? ? ?Co-evaluation   ?  ?  ?  ?  ? ? ?  ?AM-PAC PT "6 Clicks" Mobility  ?Outcome Measure Help needed turning from your back to your side while in a flat bed without using bedrails?: None ?Help needed moving from lying on your back to sitting on the side of a flat bed without using bedrails?: None ?Help needed moving to and from a bed to a chair (including a wheelchair)?: None ?Help needed standing up from a chair using your arms (e.g., wheelchair or bedside chair)?: None ?Help needed to walk in hospital room?: None ?Help needed climbing 3-5 steps with a railing? : None ?6 Click Score: 24 ? ?  ?End of Session Equipment Utilized During Treatment: Cervical collar ?Activity Tolerance: Patient tolerated treatment well ?Patient left: Other (comment) (in bathroom with husband present in room) ?Nurse Communication: Mobility status ?PT Visit Diagnosis: Muscle weakness (generalized) (M62.81) ?  ? ?Time: 2831-5176 ?PT Time Calculation (min) (ACUTE ONLY): 16 min ? ? ?Charges:   PT Evaluation ?$PT Eval Low Complexity: 1 Low ?  ?  ?   ? ? ?Matsue Strom A. Gilford Rile, PT, DPT ?Acute Rehabilitation Services ?Pager [phone] ?Office [phone] ? ? ?Ezekial Arns A Simran Mannis ?11/26/2021, 9:19 AM ? ?

## 2021-11-26 NOTE — Discharge Instructions (Signed)

## 2021-11-26 NOTE — Discharge Summary (Signed)
Physician Discharge Summary  ?Patient ID: ?Danielle Cameron ?MRN: [id number] ?DOB/AGE: Sep 22, 1966 56 y.o. ?Estimated body mass index is 33.07 kg/m? as calculated from the following: ?  Height as of this encounter: 5\' 1"  (1.549 m). ?  Weight as of this encounter: 79.4 kg. ? ? ?Admit date: 11/25/2021 ?Discharge date: 11/26/2021 ? ?Admission Diagnoses: Cervical spondylosis with radiculopathy ? ?Discharge Diagnoses: Same ?Principal Problem: ?  Spinal stenosis of cervical region ? ? ?Discharged Condition: good ? ?Hospital Course: Patient was admitted to the hospital underwent anterior cervical discectomies and fusion at C4-5 C5-6 C6-7.  Postoperative patient did very well with covering the floor on the floor was ambulating and voiding spontaneously tolerating regular diet and stable for discharge home.  Patient will be discharged with scheduled follow-up in 1 to 2 weeks. ? ?Consults: ?Significant Diagnostic Studies: ?Treatments: ACDF C4-5 C5-6 C6-7 ?Discharge Exam: ?Blood pressure (!) 177/91, pulse 95, temperature 99 ?F (37.2 ?C), temperature source Oral, resp. rate 18, height 5\' 1"  (1.549 m), weight 79.4 kg, SpO2 98 %. ?Strength 5 out of 5 wound clean dry and intact ? ?Disposition: Home ? ? ?Allergies as of 11/26/2021   ? ?   Reactions  ? Sulfa Antibiotics Nausea And Vomiting  ? Benzonatate Rash  ? Macrodantin [nitrofurantoin] Nausea And Vomiting  ? Sulfamethoxazole Nausea And Vomiting  ? ?  ? ?  ?Medication List  ?  ? ?TAKE these medications   ? ?acetaminophen 500 MG tablet ?Commonly known as: TYLENOL ?Take 1,000 mg by mouth every 6 (six) hours as needed for moderate pain. ?  ?amLODipine 10 MG tablet ?Commonly known as: NORVASC ?Take 10 mg by mouth daily. ?  ?cyclobenzaprine 10 MG tablet ?Commonly known as: FLEXERIL ?Take 1 tablet (10 mg total) by mouth 3 (three) times daily as needed for muscle spasms. ?  ?HYDROcodone-acetaminophen 5-325 MG tablet ?Commonly known as: NORCO/VICODIN ?Take 2 tablets by mouth every 4 (four)  hours as needed for severe pain ((score 7 to 10)). ?  ?labetalol 100 MG tablet ?Commonly known as: NORMODYNE ?Take 100 mg by mouth 2 (two) times daily. ?  ?loratadine 10 MG tablet ?Commonly known as: CLARITIN ?Take 10 mg by mouth daily. ?  ?meloxicam 15 MG tablet ?Commonly known as: MOBIC ?Take 15 mg by mouth daily. ?  ?omeprazole 20 MG capsule ?Commonly known as: PRILOSEC ?Take 20 mg by mouth daily. ?  ?ondansetron 4 MG tablet ?Commonly known as: Zofran ?Take 1 tablet (4 mg total) by mouth daily as needed for nausea or vomiting. ?  ?ondansetron 4 MG tablet ?Commonly known as: ZOFRAN ?Take 1 tablet (4 mg total) by mouth every 6 (six) hours as needed for nausea or vomiting. ?  ?predniSONE 10 MG tablet ?Commonly known as: DELTASONE ?Take 10 mg by mouth daily. ?  ?Repatha 140 MG/ML Sosy ?Generic drug: Evolocumab ?Inject 140 mg into the skin every 14 (fourteen) days. ?  ? ?  ? ? ? ?Signed: ?Elaina Hoops ?11/26/2021, 8:18 AM ? ? ? ?

## 2021-11-26 NOTE — Progress Notes (Signed)
Pt doing well. Pt and husband given D/C instructions with verbal understanding. Rx's were sent to the pharmacy by MD. Pt's incision is clean and dry with no sign of infection. Pt's IV and JP drain were removed prior to D/C. Pt D/C'd home via wheelchair per MD order. Pt is stable @ D/C and has no other needs at this time. Holli Humbles, RN  ?

## 2021-11-26 NOTE — Progress Notes (Signed)
OT Cancellation Note ? ?Patient Details ?Name: Danielle Cameron ?MRN: [id number] ?DOB: 1966-04-24 ? ? ?Cancelled Treatment:    Reason Eval/Treat Not Completed: OT screened, no needs identified, will sign off.  Spoke with PT and RN - pt demonstrates good understanding of precautions and was able to safely perform ADLs. ? ?Sudiksha Victor C., OTR/L ?Acute Rehabilitation Services ?Pager [phone] ?Office [phone] ? ? ?[name] ?11/26/2021, 9:09 AM ?

## 2021-12-22 ENCOUNTER — Encounter (HOSPITAL_COMMUNITY): Payer: Self-pay | Admitting: Radiology

## 2023-06-20 IMAGING — RF DG CERVICAL SPINE 1V
1 series · 2 of 2 positions shown · non-contrast
Comparison: Cervical MRI 10/28/2021

CLINICAL DATA: ACDF C4 through C7

EXAM:
DG CERVICAL SPINE - 1 VIEW

[Series 1: run · 2 of 2 slices shown]
[im 1/2]
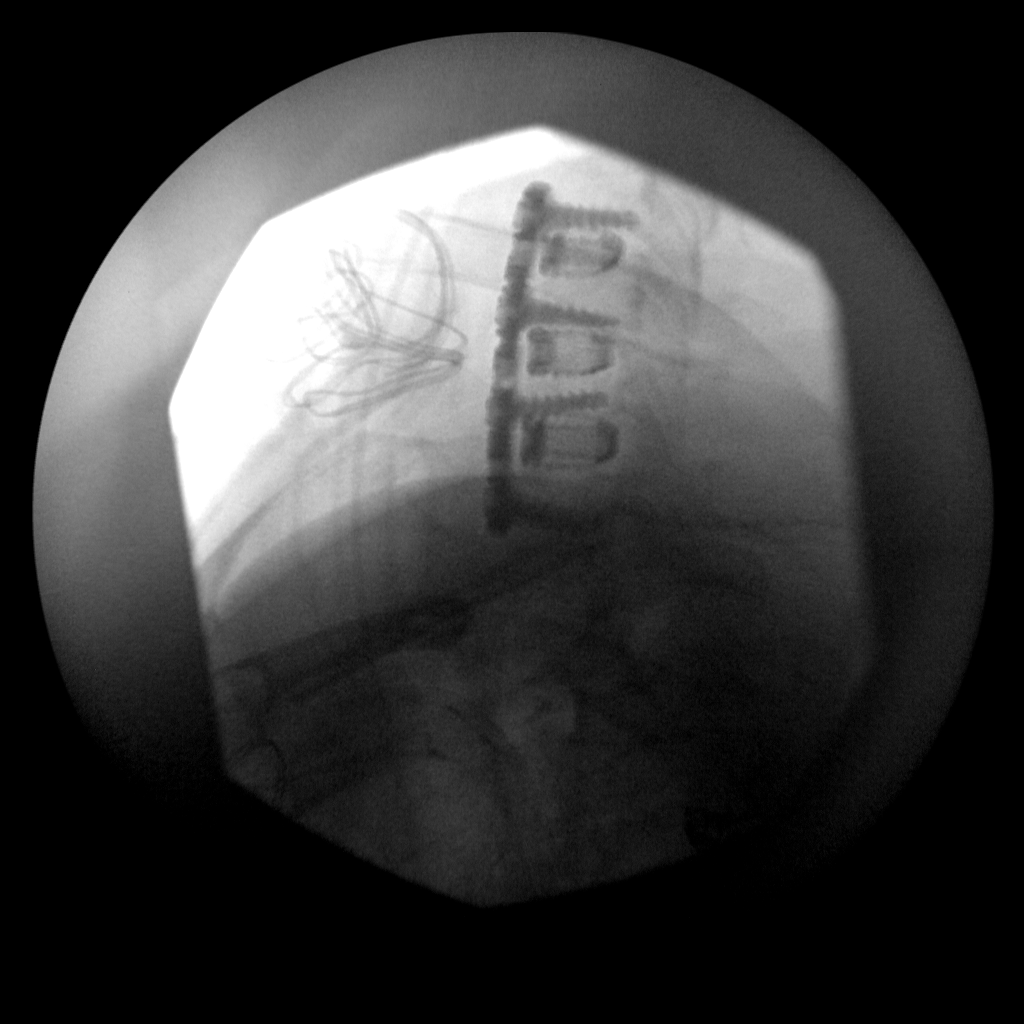
[im 2/2]
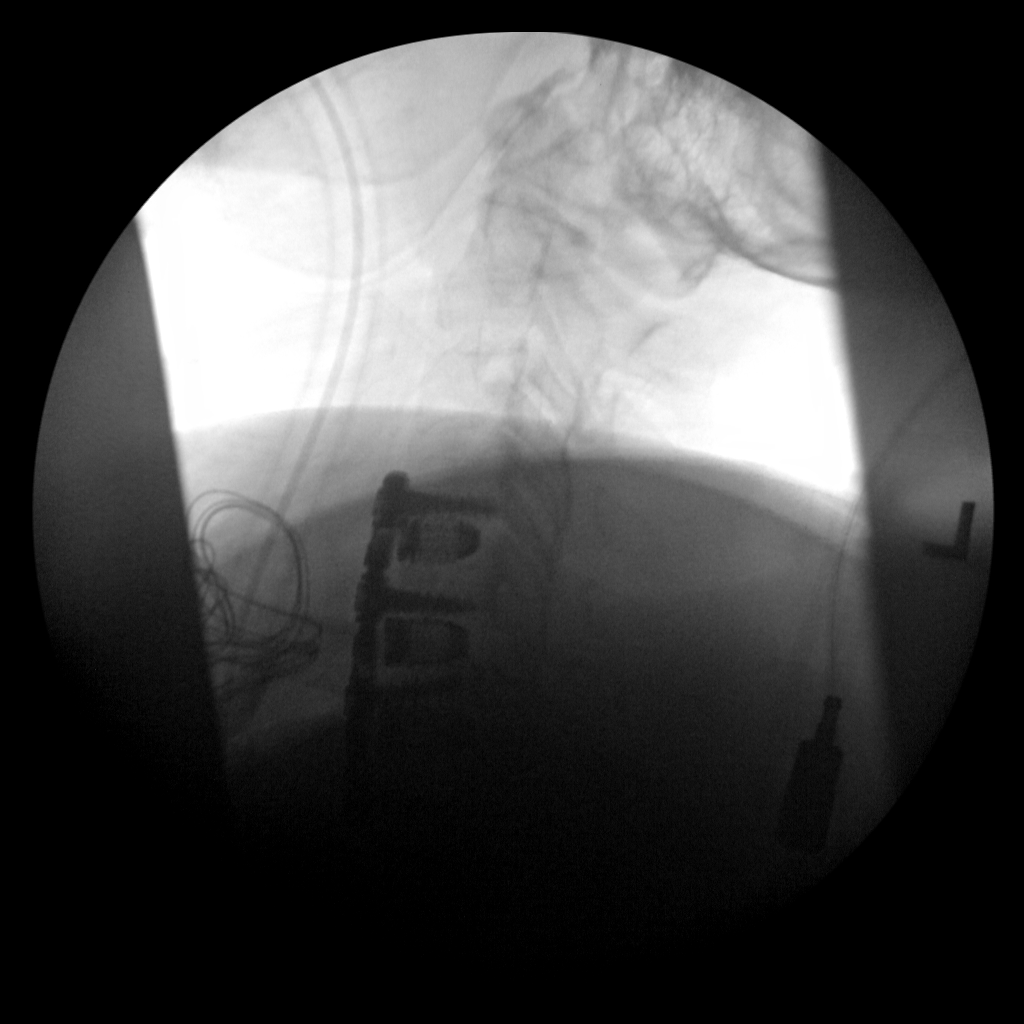

[2 of 2 positions shown; findings below may reference images not displayed]

FINDINGS: Two lateral C-arm images of the cervical spine were obtained in the
operating room. Limited bony detail. 3 level ACDF lower cervical
spine. Recommend follow-up radiographs to confirm surgical level. .
IMPRESSION: 3 level ACDF. Recommend follow-up cervical spine radiographs to
confirm surgical levels.
